# Patient Record
Sex: Male | Born: 1940 | Race: White | Hispanic: No | Marital: Married | Smoking: Current some day smoker
Health system: Southern US, Community
[De-identification: ages and names within clinical notes are randomized; demographics above are authoritative.]

## PROBLEM LIST (undated history)

## (undated) DIAGNOSIS — K219 Gastro-esophageal reflux disease without esophagitis: Secondary | ICD-10-CM

## (undated) DIAGNOSIS — J3089 Other allergic rhinitis: Secondary | ICD-10-CM

## (undated) DIAGNOSIS — M1990 Unspecified osteoarthritis, unspecified site: Secondary | ICD-10-CM

## (undated) DIAGNOSIS — E119 Type 2 diabetes mellitus without complications: Secondary | ICD-10-CM

## (undated) DIAGNOSIS — I2510 Atherosclerotic heart disease of native coronary artery without angina pectoris: Secondary | ICD-10-CM

## (undated) DIAGNOSIS — J189 Pneumonia, unspecified organism: Secondary | ICD-10-CM

## (undated) DIAGNOSIS — E785 Hyperlipidemia, unspecified: Secondary | ICD-10-CM

## (undated) DIAGNOSIS — C4490 Unspecified malignant neoplasm of skin, unspecified: Secondary | ICD-10-CM

## (undated) DIAGNOSIS — I10 Essential (primary) hypertension: Secondary | ICD-10-CM

## (undated) HISTORY — PX: CORONARY ARTERY BYPASS GRAFT: SHX141

## (undated) HISTORY — PX: COLONOSCOPY W/ POLYPECTOMY: SHX1380

## (undated) HISTORY — PX: EYE SURGERY: SHX253

## (undated) HISTORY — PX: CARDIAC CATHETERIZATION: SHX172

---

## 2015-05-19 ENCOUNTER — Other Ambulatory Visit (HOSPITAL_COMMUNITY): Payer: Self-pay | Admitting: Orthopaedic Surgery

## 2015-05-19 NOTE — Pre-Procedure Instructions (Signed)
Harold Hicks  05/19/2015     Your procedure is scheduled on : Friday May 23, 2015 at 8:45 AM.  Report to [facility] Admitting at 6:45 AM.  Call this number if you have problems the morning of surgery: [phone]    Remember:  Do not eat food or drink liquids after midnight.  Take these medicines the morning of surgery with A SIP OF WATER : Metoprolol (Toprol XL), Omeprazole (Prilosec)   Stop taking any vitamins, herbal medications/supplements, Co-Q10, Os-cal, Fish Oil, NSAIDs, Ibuprofen, Advil, Motrin, Aleve, etc today   Do NOT take any diabetic pills the morning of your surgery (NO Metformin/Glucophage)  How to Manage Your Diabetes Before Surgery   Why is it important to control my blood sugar before and after surgery?   Improving blood sugar levels before and after surgery helps healing and can limit problems.  A way of improving blood sugar control is eating a healthy diet by:  - Eating less sugar and carbohydrates  - Increasing activity/exercise  - Talk with your doctor about reaching your blood sugar goals  High blood sugars (greater than 180 mg/dL) can raise your risk of infections and slow down your recovery so you will need to focus on controlling your diabetes during the weeks before surgery.  Make sure that the doctor who takes care of your diabetes knows about your planned surgery including the date and location.  How do I manage my blood sugars before surgery?   Check your blood sugar at least 4 times a day, 2 days before surgery to make sure that they are not too high or low.   Check your blood sugar the morning of your surgery when you wake up and every 2 hours until you get to the Short-Stay unit.  If your blood sugar is less than 70 mg/dL, you will need to treat for low blood sugar by:  Treat a low blood sugar (less than 70 mg/dL) with 1/2 cup of clear juice (cranberry or apple), 4 glucose tablets, OR glucose gel.  Recheck blood sugar in  15 minutes after treatment (to make sure it is greater than 70 mg/dL).  If blood sugar is not greater than 70 mg/dL on re-check, call [phone] for further instructions.   Report your blood sugar to the Short-Stay nurse when you get to Short-Stay.  References:  [facility], 2007 "How to Manage your Diabetes Before and After Surgery".  What do I do about my diabetes medications?   Do not take oral diabetes medicines (pills) the morning of surgery.   Do not wear jewelry.  Do not wear lotions, powders, or cologne.    Men may shave face and neck.  Do not bring valuables to the hospital.  [facility] is not responsible for any belongings or valuables.  Contacts, dentures or bridgework may not be worn into surgery.  Leave your suitcase in the car.  After surgery it may be brought to your room.  For patients admitted to the hospital, discharge time will be determined by your treatment team.  Patients discharged the day of surgery will not be allowed to drive home.   Name and phone number of your driver:    Special instructions:  Shower using CHG soap the night before and the morning of your surgery  Please read over the following fact sheets that you were given. Pain Booklet, Coughing and Deep Breathing and Surgical Site Infection Prevention

## 2015-05-20 ENCOUNTER — Encounter (HOSPITAL_COMMUNITY)
Admission: RE | Admit: 2015-05-20 | Discharge: 2015-05-20 | Disposition: A | Discharge status: Home / Self Care | Payer: Medicare Other | Source: Ambulatory Visit | Attending: Orthopaedic Surgery | Admitting: Orthopaedic Surgery

## 2015-05-20 ENCOUNTER — Encounter (HOSPITAL_COMMUNITY): Payer: Self-pay

## 2015-05-20 DIAGNOSIS — Z955 Presence of coronary angioplasty implant and graft: Secondary | ICD-10-CM | POA: Diagnosis not present

## 2015-05-20 DIAGNOSIS — I10 Essential (primary) hypertension: Secondary | ICD-10-CM | POA: Insufficient documentation

## 2015-05-20 DIAGNOSIS — Z79899 Other long term (current) drug therapy: Secondary | ICD-10-CM | POA: Insufficient documentation

## 2015-05-20 DIAGNOSIS — F17200 Nicotine dependence, unspecified, uncomplicated: Secondary | ICD-10-CM | POA: Insufficient documentation

## 2015-05-20 DIAGNOSIS — Z7984 Long term (current) use of oral hypoglycemic drugs: Secondary | ICD-10-CM | POA: Insufficient documentation

## 2015-05-20 DIAGNOSIS — Z951 Presence of aortocoronary bypass graft: Secondary | ICD-10-CM | POA: Diagnosis not present

## 2015-05-20 DIAGNOSIS — Z01818 Encounter for other preprocedural examination: Secondary | ICD-10-CM | POA: Insufficient documentation

## 2015-05-20 DIAGNOSIS — I2510 Atherosclerotic heart disease of native coronary artery without angina pectoris: Secondary | ICD-10-CM | POA: Diagnosis not present

## 2015-05-20 DIAGNOSIS — Z01812 Encounter for preprocedural laboratory examination: Secondary | ICD-10-CM | POA: Diagnosis not present

## 2015-05-20 DIAGNOSIS — E785 Hyperlipidemia, unspecified: Secondary | ICD-10-CM | POA: Diagnosis not present

## 2015-05-20 DIAGNOSIS — E119 Type 2 diabetes mellitus without complications: Secondary | ICD-10-CM | POA: Insufficient documentation

## 2015-05-20 DIAGNOSIS — Z7982 Long term (current) use of aspirin: Secondary | ICD-10-CM | POA: Insufficient documentation

## 2015-05-20 DIAGNOSIS — K219 Gastro-esophageal reflux disease without esophagitis: Secondary | ICD-10-CM | POA: Diagnosis not present

## 2015-05-20 HISTORY — DX: Unspecified osteoarthritis, unspecified site: M19.90

## 2015-05-20 HISTORY — DX: Other allergic rhinitis: J30.89

## 2015-05-20 HISTORY — DX: Pneumonia, unspecified organism: J18.9

## 2015-05-20 HISTORY — DX: Essential (primary) hypertension: I10

## 2015-05-20 HISTORY — DX: Gastro-esophageal reflux disease without esophagitis: K21.9

## 2015-05-20 HISTORY — DX: Unspecified malignant neoplasm of skin, unspecified: C44.90

## 2015-05-20 HISTORY — DX: Hyperlipidemia, unspecified: E78.5

## 2015-05-20 HISTORY — DX: Type 2 diabetes mellitus without complications: E11.9

## 2015-05-20 LAB — CBC
HCT: 37.9 % — ABNORMAL LOW (ref 39.0–52.0)
HEMOGLOBIN: 12.9 g/dL — AB (ref 13.0–17.0)
MCH: 29.1 pg (ref 26.0–34.0)
MCHC: 34 g/dL (ref 30.0–36.0)
MCV: 85.6 fL (ref 78.0–100.0)
PLATELETS: 204 10*3/uL (ref 150–400)
RBC: 4.43 MIL/uL (ref 4.22–5.81)
RDW: 12.7 % (ref 11.5–15.5)
WBC: 6.6 10*3/uL (ref 4.0–10.5)

## 2015-05-20 LAB — BASIC METABOLIC PANEL
ANION GAP: 16 — AB (ref 5–15)
BUN: 19 mg/dL (ref 6–20)
CHLORIDE: 94 mmol/L — AB (ref 101–111)
CO2: 26 mmol/L (ref 22–32)
Calcium: 9.4 mg/dL (ref 8.9–10.3)
Creatinine, Ser: 0.93 mg/dL (ref 0.61–1.24)
GFR calc Af Amer: >60 mL/min (ref >60)
GLUCOSE: 212 mg/dL — AB (ref 65–99)
POTASSIUM: 3.8 mmol/L (ref 3.5–5.1)
SODIUM: 136 mmol/L (ref 135–145)

## 2015-05-20 LAB — GLUCOSE, CAPILLARY: Glucose-Capillary: 202 mg/dL — ABNORMAL HIGH (ref 65–99)

## 2015-05-20 NOTE — Progress Notes (Signed)
Abnormal EKG noted. Will request comparison from PCP's office.

## 2015-05-20 NOTE — Progress Notes (Signed)
PCP is Garlan Fillers, DO  Patient informed Nurse that he had a open heart surgery about six or seven years ago at [facility]. Patient informed Nurse that he does not have a cardiologist, he only sees Dr. Nicola Girt.  Patient denied having any acute cardiac or pulmonary issues  Nurse inquired about fasting blood glucose levels, and patient informed Nurse that his blood sugars range from 240 to 180, and his last A1C was 7.8. CBG on arrival to PAT was 202; patient stated just left [employer] and had breakfast, and drank two cups of coffee.

## 2015-05-21 ENCOUNTER — Encounter (HOSPITAL_COMMUNITY): Payer: Self-pay | Admitting: Emergency Medicine

## 2015-05-21 LAB — HEMOGLOBIN A1C
Hgb A1c MFr Bld: 8.3 % — ABNORMAL HIGH (ref 4.8–5.6)
MEAN PLASMA GLUCOSE: 192 mg/dL

## 2015-05-21 NOTE — Progress Notes (Signed)
Anesthesia Chart Review:  Pt is a 75 year old male scheduled for ORIF L humeral midshaft fracture nonunion on 05/23/2015 with Dr. Erlinda Hong.   PMH includes:  CAD (S/p BMS x3 to ramus intermediate 12/02/07 that failed -> s/p CABG (LIMA to LAD, SVG to ramus 12/06/07) (at [facility])), HTN, DM, hyperlipidemia, GERD. Current smoker. BMI 30.   Medications include: ASA, hctz, metformin, metoprolol, olmesartan, prilosec, simvastatin.   Preoperative labs reviewed.  Glucose 212, hgbA1c 8.3.   EKG 05/20/15: Sinus rhythm. Possible Anterior infarct, age undetermined. ST & T wave abnormality, consider lateral ischemia. I cannot locate an EKG for comparison.   Echo 12/05/07:  - The left ventricle is normal in size. There is normal left ventricular wall thickness.  - Mild aortic sclerosis is present with good valvular opening.  - The left ventricular ejection fraction is normal (60-65%).  - There is mild mitral regurgitation (1+).  - There is mild tricuspid regurgitation (1+).  Cardiac cath 12/04/07 (care everywhere): - LM: normal - LAD: prox 20%, mid 40-50% -> farther down in mid area was diffuse 40% disease - CX: prox 40%, remainder free of disease. OM1 100% occluded at ostium. It is this OM that had 3 BMS placed less than a week ago - RCA: free of disease - LV with mildly anterolateral hypokinesis. EF 55%  Reviewed case with Dr. Linna Caprice. Pt has not seen cardiologist in years. Pt will need cardiac evaluation prior to surgery. Notified Sherrie in Dr. Phoebe Sharps office.   Willeen Cass, FNP-BC [facility] Short Stay Surgical Center/Anesthesiology Phone: [phone] 05/21/2015 3:31 PM

## 2015-05-23 ENCOUNTER — Encounter (HOSPITAL_COMMUNITY): Admission: RE | Payer: Self-pay | Source: Ambulatory Visit

## 2015-05-23 ENCOUNTER — Ambulatory Visit (HOSPITAL_COMMUNITY): Admission: RE | Admit: 2015-05-23 | Payer: Medicare Other | Source: Ambulatory Visit | Admitting: Orthopaedic Surgery

## 2015-05-23 SURGERY — OPEN REDUCTION INTERNAL FIXATION (ORIF) HUMERAL SHAFT FRACTURE
Anesthesia: General | Laterality: Left

## 2015-06-05 ENCOUNTER — Encounter (HOSPITAL_COMMUNITY): Payer: Self-pay | Admitting: Emergency Medicine

## 2015-06-05 MED ORDER — CEFAZOLIN SODIUM-DEXTROSE 2-3 GM-% IV SOLR
2.0000 g | INTRAVENOUS | Status: AC
  Administered 2015-06-06: 2 g via INTRAVENOUS
  Filled 2015-06-05: qty 50

## 2015-06-05 NOTE — Progress Notes (Signed)
I spoke with patient to confirm arrival time and date, 06/05/25- 9:45 AM

## 2015-06-05 NOTE — Progress Notes (Signed)
Anesthesia Chart Review:  Pt is a 75 year old male scheduled for ORIF L humerus nonunion on 06/06/2015 with Dr. Erlinda Hong.   Pt is a same day work up.   PCP is Dr. Garlan Fillers (care everywhere), who has cleared pt for surgery.   PMH includes: CAD (S/p BMS x3 to ramus intermediate 12/02/07 that failed -> s/p CABG (LIMA to LAD, SVG to ramus 12/06/07) (at [facility])), HTN, DM, hyperlipidemia, GERD. Current smoker. BMI 30.   Medications include: ASA, hctz, metformin, metoprolol, olmesartan, prilosec, simvastatin.   Pt will need labs DOS.   EKG 05/20/15: Sinus rhythm. Possible Anterior infarct, age undetermined. ST & T wave abnormality, consider lateral ischemia. I cannot locate an EKG for comparison.   Echo 12/05/07:  - The left ventricle is normal in size. There is normal left ventricular wall thickness.  - Mild aortic sclerosis is present with good valvular opening.  - The left ventricular ejection fraction is normal (60-65%).  - There is mild mitral regurgitation (1+).  - There is mild tricuspid regurgitation (1+).  Cardiac cath 12/04/07 (care everywhere): - LM: normal - LAD: prox 20%, mid 40-50% -> farther down in mid area was diffuse 40% disease - CX: prox 40%, remainder free of disease. OM1 100% occluded at ostium. It is this OM that had 3 BMS placed less than a week ago - RCA: free of disease - LV with mildly anterolateral hypokinesis. EF 55%  Pt was originally scheduled for surgery 05/23/15 but hadn't seen cardiology in many years, and he was asked to get evaluated by cardiology prior to surgery. Pt saw Dr. Michiel Sites Noureddine (care everywhere) on 05/28/15.  Dr. Benay Pillow clears pt for surgery, noting "Patient can do > 4 METS with no symptoms. The patient has acceptable perioperative risk for cardiac events related to surgery".   If labs acceptable DOS, I anticipate pt can proceed as scheduled.   Willeen Cass, FNP-BC [facility] Short Stay Surgical Center/Anesthesiology Phone:  [phone] 06/05/2015 1:12 PM

## 2015-06-06 ENCOUNTER — Ambulatory Visit (HOSPITAL_COMMUNITY): Payer: Medicare Other | Admitting: Emergency Medicine

## 2015-06-06 ENCOUNTER — Ambulatory Visit (HOSPITAL_COMMUNITY): Payer: Medicare Other

## 2015-06-06 ENCOUNTER — Encounter (HOSPITAL_COMMUNITY)
Admission: RE | Disposition: A | Discharge status: Home / Self Care | Payer: Self-pay | Source: Ambulatory Visit | Attending: Orthopaedic Surgery

## 2015-06-06 ENCOUNTER — Observation Stay (HOSPITAL_COMMUNITY)
Admission: RE | Admit: 2015-06-06 | Discharge: 2015-06-07 | Disposition: A | Discharge status: Home / Self Care | Payer: Medicare Other | Source: Ambulatory Visit | Attending: Orthopaedic Surgery | Admitting: Orthopaedic Surgery

## 2015-06-06 ENCOUNTER — Encounter (HOSPITAL_COMMUNITY): Payer: Self-pay | Admitting: *Deleted

## 2015-06-06 DIAGNOSIS — Z955 Presence of coronary angioplasty implant and graft: Secondary | ICD-10-CM | POA: Diagnosis not present

## 2015-06-06 DIAGNOSIS — I2510 Atherosclerotic heart disease of native coronary artery without angina pectoris: Secondary | ICD-10-CM | POA: Diagnosis not present

## 2015-06-06 DIAGNOSIS — Z7984 Long term (current) use of oral hypoglycemic drugs: Secondary | ICD-10-CM | POA: Insufficient documentation

## 2015-06-06 DIAGNOSIS — Z7982 Long term (current) use of aspirin: Secondary | ICD-10-CM | POA: Diagnosis not present

## 2015-06-06 DIAGNOSIS — Z419 Encounter for procedure for purposes other than remedying health state, unspecified: Secondary | ICD-10-CM

## 2015-06-06 DIAGNOSIS — M1990 Unspecified osteoarthritis, unspecified site: Secondary | ICD-10-CM | POA: Insufficient documentation

## 2015-06-06 DIAGNOSIS — S42302K Unspecified fracture of shaft of humerus, left arm, subsequent encounter for fracture with nonunion: Principal | ICD-10-CM | POA: Insufficient documentation

## 2015-06-06 DIAGNOSIS — M79602 Pain in left arm: Secondary | ICD-10-CM | POA: Insufficient documentation

## 2015-06-06 DIAGNOSIS — I10 Essential (primary) hypertension: Secondary | ICD-10-CM | POA: Diagnosis not present

## 2015-06-06 DIAGNOSIS — K219 Gastro-esophageal reflux disease without esophagitis: Secondary | ICD-10-CM | POA: Insufficient documentation

## 2015-06-06 DIAGNOSIS — Z8781 Personal history of (healed) traumatic fracture: Secondary | ICD-10-CM

## 2015-06-06 DIAGNOSIS — X58XXXD Exposure to other specified factors, subsequent encounter: Secondary | ICD-10-CM | POA: Insufficient documentation

## 2015-06-06 DIAGNOSIS — E785 Hyperlipidemia, unspecified: Secondary | ICD-10-CM | POA: Insufficient documentation

## 2015-06-06 DIAGNOSIS — Z85828 Personal history of other malignant neoplasm of skin: Secondary | ICD-10-CM | POA: Diagnosis not present

## 2015-06-06 DIAGNOSIS — F17290 Nicotine dependence, other tobacco product, uncomplicated: Secondary | ICD-10-CM | POA: Insufficient documentation

## 2015-06-06 DIAGNOSIS — Z9889 Other specified postprocedural states: Secondary | ICD-10-CM

## 2015-06-06 DIAGNOSIS — E119 Type 2 diabetes mellitus without complications: Secondary | ICD-10-CM | POA: Insufficient documentation

## 2015-06-06 DIAGNOSIS — Z951 Presence of aortocoronary bypass graft: Secondary | ICD-10-CM | POA: Diagnosis not present

## 2015-06-06 DIAGNOSIS — Z79899 Other long term (current) drug therapy: Secondary | ICD-10-CM | POA: Diagnosis not present

## 2015-06-06 HISTORY — DX: Atherosclerotic heart disease of native coronary artery without angina pectoris: I25.10

## 2015-06-06 HISTORY — PX: ORIF HUMERUS FRACTURE: SHX2126

## 2015-06-06 LAB — BASIC METABOLIC PANEL
Anion gap: 15 (ref 5–15)
BUN: 15 mg/dL (ref 6–20)
CALCIUM: 9.6 mg/dL (ref 8.9–10.3)
CO2: 26 mmol/L (ref 22–32)
CREATININE: 0.81 mg/dL (ref 0.61–1.24)
Chloride: 95 mmol/L — ABNORMAL LOW (ref 101–111)
GFR calc Af Amer: >60 mL/min (ref >60)
GFR calc non Af Amer: >60 mL/min (ref >60)
GLUCOSE: 181 mg/dL — AB (ref 65–99)
Potassium: 3.6 mmol/L (ref 3.5–5.1)
Sodium: 136 mmol/L (ref 135–145)

## 2015-06-06 LAB — CBC
HCT: 38 % — ABNORMAL LOW (ref 39.0–52.0)
Hemoglobin: 13.7 g/dL (ref 13.0–17.0)
MCH: 30.3 pg (ref 26.0–34.0)
MCHC: 36.1 g/dL — AB (ref 30.0–36.0)
MCV: 84.1 fL (ref 78.0–100.0)
Platelets: 189 10*3/uL (ref 150–400)
RBC: 4.52 MIL/uL (ref 4.22–5.81)
RDW: 12.7 % (ref 11.5–15.5)
WBC: 8.5 10*3/uL (ref 4.0–10.5)

## 2015-06-06 LAB — GLUCOSE, CAPILLARY: Glucose-Capillary: 189 mg/dL — ABNORMAL HIGH (ref 65–99)

## 2015-06-06 SURGERY — OPEN REDUCTION INTERNAL FIXATION (ORIF) HUMERAL SHAFT FRACTURE
Anesthesia: Regional | Laterality: Left

## 2015-06-06 MED ORDER — CALCIUM CARBONATE-VITAMIN D 500-200 MG-UNIT PO TABS: 1.0000 | ORAL_TABLET | Freq: Three times a day (TID) | ORAL | Status: AC

## 2015-06-06 MED ORDER — SENNOSIDES-DOCUSATE SODIUM 8.6-50 MG PO TABS: 1.0000 | ORAL_TABLET | Freq: Every evening | ORAL | Status: AC | PRN

## 2015-06-06 MED ORDER — ACETAMINOPHEN 325 MG PO TABS
650.0000 mg | ORAL_TABLET | Freq: Four times a day (QID) | ORAL | Status: DC | PRN
  Administered 2015-06-07: 650 mg via ORAL
  Filled 2015-06-06: qty 2

## 2015-06-06 MED ORDER — ROCURONIUM BROMIDE 50 MG/5ML IV SOLN
INTRAVENOUS | Status: AC
  Filled 2015-06-06: qty 1

## 2015-06-06 MED ORDER — LIDOCAINE HCL (CARDIAC) 20 MG/ML IV SOLN
INTRAVENOUS | Status: AC
  Filled 2015-06-06: qty 5

## 2015-06-06 MED ORDER — COENZYME Q10 30 MG PO CAPS: 30.0000 mg | ORAL_CAPSULE | Freq: Every day | ORAL | Status: DC

## 2015-06-06 MED ORDER — METHOCARBAMOL 1000 MG/10ML IJ SOLN
500.0000 mg | Freq: Four times a day (QID) | INTRAVENOUS | Status: DC | PRN
  Filled 2015-06-06: qty 5

## 2015-06-06 MED ORDER — VANCOMYCIN HCL 1000 MG IV SOLR
INTRAVENOUS | Status: DC | PRN
  Administered 2015-06-06: 1000 mg

## 2015-06-06 MED ORDER — CEFAZOLIN SODIUM-DEXTROSE 2-3 GM-% IV SOLR
2.0000 g | Freq: Four times a day (QID) | INTRAVENOUS | Status: AC
  Administered 2015-06-06 – 2015-06-07 (×3): 2 g via INTRAVENOUS
  Filled 2015-06-06 (×3): qty 50

## 2015-06-06 MED ORDER — OXYCODONE HCL ER 10 MG PO T12A
10.0000 mg | EXTENDED_RELEASE_TABLET | Freq: Two times a day (BID) | ORAL | Status: AC
Start: 2015-06-06 — End: ?

## 2015-06-06 MED ORDER — CALCIUM CARBONATE 1250 (500 CA) MG PO TABS
1250.0000 mg | ORAL_TABLET | Freq: Every day | ORAL | Status: DC
  Administered 2015-06-07: 1250 mg via ORAL
  Filled 2015-06-06 (×2): qty 1

## 2015-06-06 MED ORDER — SORBITOL 70 % SOLN: 30.0000 mL | Freq: Every day | Status: DC | PRN

## 2015-06-06 MED ORDER — SODIUM CHLORIDE 0.9 % IV SOLN
INTRAVENOUS | Status: DC
  Administered 2015-06-06: 125 mL/h via INTRAVENOUS

## 2015-06-06 MED ORDER — LACTATED RINGERS IV SOLN
INTRAVENOUS | Status: DC
  Administered 2015-06-06 (×3): via INTRAVENOUS

## 2015-06-06 MED ORDER — PROMETHAZINE HCL 25 MG/ML IJ SOLN: 6.2500 mg | INTRAMUSCULAR | Status: DC | PRN

## 2015-06-06 MED ORDER — FENTANYL CITRATE (PF) 100 MCG/2ML IJ SOLN: 25.0000 ug | INTRAMUSCULAR | Status: DC | PRN

## 2015-06-06 MED ORDER — ONDANSETRON HCL 4 MG/2ML IJ SOLN
INTRAMUSCULAR | Status: AC
  Filled 2015-06-06: qty 2

## 2015-06-06 MED ORDER — PHENYLEPHRINE HCL 10 MG/ML IJ SOLN
10.0000 mg | INTRAVENOUS | Status: DC | PRN
  Administered 2015-06-06: 20 ug/min via INTRAVENOUS

## 2015-06-06 MED ORDER — METOCLOPRAMIDE HCL 5 MG/ML IJ SOLN: 5.0000 mg | Freq: Three times a day (TID) | INTRAMUSCULAR | Status: DC | PRN

## 2015-06-06 MED ORDER — VANCOMYCIN HCL 1000 MG IV SOLR
INTRAVENOUS | Status: AC
  Filled 2015-06-06: qty 1000

## 2015-06-06 MED ORDER — DIPHENHYDRAMINE HCL 12.5 MG/5ML PO ELIX: 25.0000 mg | ORAL_SOLUTION | ORAL | Status: DC | PRN

## 2015-06-06 MED ORDER — OXYCODONE HCL ER 10 MG PO T12A
10.0000 mg | EXTENDED_RELEASE_TABLET | Freq: Two times a day (BID) | ORAL | Status: DC
  Administered 2015-06-07: 10 mg via ORAL
  Filled 2015-06-06 (×2): qty 1

## 2015-06-06 MED ORDER — EPHEDRINE SULFATE 50 MG/ML IJ SOLN
INTRAMUSCULAR | Status: DC | PRN
  Administered 2015-06-06: 10 mg via INTRAVENOUS
  Administered 2015-06-06: 15 mg via INTRAVENOUS

## 2015-06-06 MED ORDER — SIMVASTATIN 20 MG PO TABS
20.0000 mg | ORAL_TABLET | Freq: Every evening | ORAL | Status: DC
  Administered 2015-06-06: 20 mg via ORAL
  Filled 2015-06-06: qty 1

## 2015-06-06 MED ORDER — ONDANSETRON HCL 4 MG/2ML IJ SOLN
INTRAMUSCULAR | Status: DC | PRN
  Administered 2015-06-06 (×2): 4 mg via INTRAVENOUS

## 2015-06-06 MED ORDER — OXYCODONE-ACETAMINOPHEN 5-325 MG PO TABS: 1.0000 | ORAL_TABLET | ORAL | Status: AC | PRN

## 2015-06-06 MED ORDER — ONDANSETRON HCL 4 MG PO TABS: 4.0000 mg | ORAL_TABLET | Freq: Four times a day (QID) | ORAL | Status: DC | PRN

## 2015-06-06 MED ORDER — MONTELUKAST SODIUM 10 MG PO TABS
10.0000 mg | ORAL_TABLET | Freq: Every day | ORAL | Status: DC
  Administered 2015-06-06: 10 mg via ORAL
  Filled 2015-06-06: qty 1

## 2015-06-06 MED ORDER — MAGNESIUM CITRATE PO SOLN: 1.0000 | Freq: Once | ORAL | Status: DC | PRN

## 2015-06-06 MED ORDER — FENTANYL CITRATE (PF) 250 MCG/5ML IJ SOLN
INTRAMUSCULAR | Status: AC
  Filled 2015-06-06: qty 5

## 2015-06-06 MED ORDER — METOCLOPRAMIDE HCL 5 MG PO TABS: 5.0000 mg | ORAL_TABLET | Freq: Three times a day (TID) | ORAL | Status: DC | PRN

## 2015-06-06 MED ORDER — METOPROLOL SUCCINATE ER 100 MG PO TB24
100.0000 mg | ORAL_TABLET | Freq: Every day | ORAL | Status: DC
  Administered 2015-06-07: 100 mg via ORAL
  Filled 2015-06-06 (×3): qty 1

## 2015-06-06 MED ORDER — ZINC SULFATE 220 (50 ZN) MG PO CAPS: 220.0000 mg | ORAL_CAPSULE | Freq: Every day | ORAL | Status: AC

## 2015-06-06 MED ORDER — ROCURONIUM BROMIDE 100 MG/10ML IV SOLN
INTRAVENOUS | Status: DC | PRN
Start: 2015-06-06 — End: 2015-06-06
  Administered 2015-06-06: 40 mg via INTRAVENOUS

## 2015-06-06 MED ORDER — ZINC SULFATE 220 (50 ZN) MG PO CAPS
220.0000 mg | ORAL_CAPSULE | Freq: Every day | ORAL | Status: DC
  Administered 2015-06-06 – 2015-06-07 (×2): 220 mg via ORAL
  Filled 2015-06-06 (×2): qty 1

## 2015-06-06 MED ORDER — ACETAMINOPHEN 650 MG RE SUPP: 650.0000 mg | Freq: Four times a day (QID) | RECTAL | Status: DC | PRN

## 2015-06-06 MED ORDER — DEXAMETHASONE SODIUM PHOSPHATE 4 MG/ML IJ SOLN
INTRAMUSCULAR | Status: AC
  Filled 2015-06-06: qty 2

## 2015-06-06 MED ORDER — METHOCARBAMOL 750 MG PO TABS: 750.0000 mg | ORAL_TABLET | Freq: Two times a day (BID) | ORAL | Status: AC | PRN

## 2015-06-06 MED ORDER — MEPERIDINE HCL 25 MG/ML IJ SOLN: 6.2500 mg | INTRAMUSCULAR | Status: DC | PRN

## 2015-06-06 MED ORDER — LACTATED RINGERS IV SOLN: INTRAVENOUS | Status: DC

## 2015-06-06 MED ORDER — GLYCOPYRROLATE 0.2 MG/ML IJ SOLN
INTRAMUSCULAR | Status: DC | PRN
  Administered 2015-06-06: 0.2 mg via INTRAVENOUS

## 2015-06-06 MED ORDER — DEXAMETHASONE SODIUM PHOSPHATE 4 MG/ML IJ SOLN: INTRAMUSCULAR | Status: DC | PRN

## 2015-06-06 MED ORDER — BUPIVACAINE HCL (PF) 0.25 % IJ SOLN
INTRAMUSCULAR | Status: AC
  Filled 2015-06-06: qty 30

## 2015-06-06 MED ORDER — PROPOFOL 10 MG/ML IV BOLUS
INTRAVENOUS | Status: AC
  Filled 2015-06-06: qty 20

## 2015-06-06 MED ORDER — HYDROCHLOROTHIAZIDE 25 MG PO TABS
25.0000 mg | ORAL_TABLET | Freq: Every day | ORAL | Status: DC
  Administered 2015-06-06 – 2015-06-07 (×2): 25 mg via ORAL
  Filled 2015-06-06 (×2): qty 1

## 2015-06-06 MED ORDER — VITAMIN C 500 MG PO CHEW: 500.0000 mg | CHEWABLE_TABLET | Freq: Two times a day (BID) | ORAL | Status: AC

## 2015-06-06 MED ORDER — ONDANSETRON HCL 4 MG/2ML IJ SOLN: 4.0000 mg | Freq: Four times a day (QID) | INTRAMUSCULAR | Status: DC | PRN

## 2015-06-06 MED ORDER — POLYETHYLENE GLYCOL 3350 17 G PO PACK: 17.0000 g | PACK | Freq: Every day | ORAL | Status: DC | PRN

## 2015-06-06 MED ORDER — NEOSTIGMINE METHYLSULFATE 10 MG/10ML IV SOLN
INTRAVENOUS | Status: DC | PRN
  Administered 2015-06-06: 2 mg via INTRAVENOUS

## 2015-06-06 MED ORDER — BUPIVACAINE-EPINEPHRINE (PF) 0.5% -1:200000 IJ SOLN
INTRAMUSCULAR | Status: DC | PRN
  Administered 2015-06-06: 30 mL via PERINEURAL

## 2015-06-06 MED ORDER — INSULIN ASPART 100 UNIT/ML ~~LOC~~ SOLN: 0.0000 [IU] | Freq: Every day | SUBCUTANEOUS | Status: DC

## 2015-06-06 MED ORDER — PROPOFOL 10 MG/ML IV BOLUS
INTRAVENOUS | Status: DC | PRN
  Administered 2015-06-06: 150 mg via INTRAVENOUS

## 2015-06-06 MED ORDER — METHOCARBAMOL 500 MG PO TABS
500.0000 mg | ORAL_TABLET | Freq: Four times a day (QID) | ORAL | Status: DC | PRN
  Administered 2015-06-07: 500 mg via ORAL
  Filled 2015-06-06: qty 1

## 2015-06-06 MED ORDER — MORPHINE SULFATE (PF) 2 MG/ML IV SOLN: 2.0000 mg | INTRAVENOUS | Status: DC | PRN

## 2015-06-06 MED ORDER — OXYCODONE HCL 5 MG PO TABS
5.0000 mg | ORAL_TABLET | ORAL | Status: DC | PRN
  Administered 2015-06-07: 15 mg via ORAL
  Administered 2015-06-07 (×3): 10 mg via ORAL
  Filled 2015-06-06 (×2): qty 2
  Filled 2015-06-06: qty 3
  Filled 2015-06-06: qty 2

## 2015-06-06 MED ORDER — FENTANYL CITRATE (PF) 100 MCG/2ML IJ SOLN
INTRAMUSCULAR | Status: DC | PRN
  Administered 2015-06-06 (×2): 50 ug via INTRAVENOUS
  Administered 2015-06-06: 100 ug via INTRAVENOUS
  Administered 2015-06-06: 50 ug via INTRAVENOUS

## 2015-06-06 MED ORDER — TEMAZEPAM 15 MG PO CAPS
15.0000 mg | ORAL_CAPSULE | Freq: Every evening | ORAL | Status: DC | PRN
  Administered 2015-06-06: 15 mg via ORAL
  Filled 2015-06-06: qty 1

## 2015-06-06 MED ORDER — INSULIN ASPART 100 UNIT/ML ~~LOC~~ SOLN
0.0000 [IU] | Freq: Three times a day (TID) | SUBCUTANEOUS | Status: DC
  Administered 2015-06-07: 3 [IU] via SUBCUTANEOUS
  Administered 2015-06-07: 5 [IU] via SUBCUTANEOUS

## 2015-06-06 MED ORDER — PHENYLEPHRINE HCL 10 MG/ML IJ SOLN
INTRAMUSCULAR | Status: DC | PRN
  Administered 2015-06-06 (×3): 80 ug via INTRAVENOUS

## 2015-06-06 MED ORDER — LIDOCAINE HCL (CARDIAC) 20 MG/ML IV SOLN
INTRAVENOUS | Status: DC | PRN
  Administered 2015-06-06: 60 mg via INTRAVENOUS

## 2015-06-06 MED ORDER — ONDANSETRON HCL 4 MG PO TABS: 4.0000 mg | ORAL_TABLET | Freq: Three times a day (TID) | ORAL | Status: AC | PRN

## 2015-06-06 MED ORDER — IRBESARTAN 300 MG PO TABS
300.0000 mg | ORAL_TABLET | Freq: Every day | ORAL | Status: DC
  Administered 2015-06-06 – 2015-06-07 (×2): 300 mg via ORAL
  Filled 2015-06-06 (×2): qty 1

## 2015-06-06 MED ORDER — VITAMIN C 500 MG PO TABS
500.0000 mg | ORAL_TABLET | Freq: Two times a day (BID) | ORAL | Status: DC
  Administered 2015-06-06 – 2015-06-07 (×2): 500 mg via ORAL
  Filled 2015-06-06 (×2): qty 1

## 2015-06-06 MED ORDER — PANTOPRAZOLE SODIUM 40 MG PO TBEC
40.0000 mg | DELAYED_RELEASE_TABLET | Freq: Every day | ORAL | Status: DC
  Administered 2015-06-07: 40 mg via ORAL
  Filled 2015-06-06 (×2): qty 1

## 2015-06-06 SURGICAL SUPPLY — 69 items
BIT DRILL 3.5 QC 155 (BIT) ×2 IMPLANT
BLADE SURG 10 STRL SS (BLADE) IMPLANT
BNDG ESMARK 4X9 LF (GAUZE/BANDAGES/DRESSINGS) IMPLANT
CLOSURE STERI-STRIP 1/4X4 (GAUZE/BANDAGES/DRESSINGS) ×2 IMPLANT
CLSR STERI-STRIP ANTIMIC 1/2X4 (GAUZE/BANDAGES/DRESSINGS) ×2 IMPLANT
COVER SURGICAL LIGHT HANDLE (MISCELLANEOUS) ×2 IMPLANT
CUFF TOURNIQUET SINGLE 18IN (TOURNIQUET CUFF) ×2 IMPLANT
CUFF TOURNIQUET SINGLE 24IN (TOURNIQUET CUFF) IMPLANT
DRAPE C-ARM 42X72 X-RAY (DRAPES) ×2 IMPLANT
DRAPE IMP U-DRAPE 54X76 (DRAPES) ×2 IMPLANT
DRAPE INCISE IOBAN 66X45 STRL (DRAPES) ×2 IMPLANT
DRAPE PROXIMA HALF (DRAPES) ×4 IMPLANT
DRAPE U-SHAPE 47X51 STRL (DRAPES) ×2 IMPLANT
DRSG AQUACEL AG ADV 3.5X10 (GAUZE/BANDAGES/DRESSINGS) ×2 IMPLANT
DRSG TEGADERM 4X4.75 (GAUZE/BANDAGES/DRESSINGS) ×8 IMPLANT
ELECT CAUTERY BLADE 6.4 (BLADE) ×2 IMPLANT
ELECT REM PT RETURN 9FT ADLT (ELECTROSURGICAL) ×2
ELECTRODE REM PT RTRN 9FT ADLT (ELECTROSURGICAL) ×1 IMPLANT
FACESHIELD WRAPAROUND (MASK) ×2 IMPLANT
GAUZE SPONGE 4X4 12PLY STRL (GAUZE/BANDAGES/DRESSINGS) ×2 IMPLANT
GAUZE XEROFORM 5X9 LF (GAUZE/BANDAGES/DRESSINGS) ×2 IMPLANT
GLOVE BIOGEL PI IND STRL 6.5 (GLOVE) ×1 IMPLANT
GLOVE BIOGEL PI IND STRL 7.0 (GLOVE) ×2 IMPLANT
GLOVE BIOGEL PI INDICATOR 6.5 (GLOVE) ×1
GLOVE BIOGEL PI INDICATOR 7.0 (GLOVE) ×2
GLOVE SKINSENSE NS SZ7.5 (GLOVE) ×2
GLOVE SKINSENSE STRL SZ7.5 (GLOVE) ×2 IMPLANT
GLOVE SS BIOGEL STRL SZ 6.5 (GLOVE) ×2 IMPLANT
GLOVE SS BIOGEL STRL SZ 7.5 (GLOVE) ×2 IMPLANT
GLOVE SUPERSENSE BIOGEL SZ 6.5 (GLOVE) ×2
GLOVE SUPERSENSE BIOGEL SZ 7.5 (GLOVE) ×2
GOWN STRL REIN XL XLG (GOWN DISPOSABLE) ×6 IMPLANT
K-WIRE 2.0 (WIRE) ×2
K-WIRE FX228X2XTROC PNT (WIRE) ×2
KIT BASIN OR (CUSTOM PROCEDURE TRAY) ×2 IMPLANT
KIT ROOM TURNOVER OR (KITS) ×2 IMPLANT
KIT STIMULAN RAPID CURE 5CC (Orthopedic Implant) ×2 IMPLANT
KWIRE FX228X2XTROC PNT (WIRE) ×2 IMPLANT
LOOP VESSEL MAXI BLUE (MISCELLANEOUS) ×2 IMPLANT
MANIFOLD NEPTUNE II (INSTRUMENTS) ×2 IMPLANT
NS IRRIG 1000ML POUR BTL (IV SOLUTION) ×2 IMPLANT
PACK SHOULDER (CUSTOM PROCEDURE TRAY) ×2 IMPLANT
PACK UNIVERSAL I (CUSTOM PROCEDURE TRAY) ×2 IMPLANT
PAD ARMBOARD 7.5X6 YLW CONV (MISCELLANEOUS) ×4 IMPLANT
PAD CAST 4YDX4 CTTN HI CHSV (CAST SUPPLIES) ×1 IMPLANT
PADDING CAST COTTON 4X4 STRL (CAST SUPPLIES) ×1
PLATE 9-HOLE (Plate) ×2 IMPLANT
PUTTY DBX 5CC (Putty) ×2 IMPLANT
SCREW 3.5X36 (Screw) ×2 IMPLANT
SCREW 4.5X26 (Screw) ×4 IMPLANT
SCREW 4.5X28 (Screw) ×2 IMPLANT
SCREW NONLOCK 4.5X32 (Screw) ×4 IMPLANT
SLING ARM FOAM STRAP LRG (SOFTGOODS) ×2 IMPLANT
SLING ARM IMMOBILIZER LRG (SOFTGOODS) ×2 IMPLANT
SPONGE LAP 18X18 X RAY DECT (DISPOSABLE) ×2 IMPLANT
STAPLER VISISTAT 35W (STAPLE) IMPLANT
SUCTION FRAZIER HANDLE 10FR (MISCELLANEOUS)
SUCTION TUBE FRAZIER 10FR DISP (MISCELLANEOUS) IMPLANT
SUT ETHILON 3 0 PS 1 (SUTURE) ×4 IMPLANT
SUT MNCRL AB 4-0 PS2 18 (SUTURE) ×2 IMPLANT
SUT VIC AB 0 CT1 27 (SUTURE) ×1
SUT VIC AB 0 CT1 27XBRD ANBCTR (SUTURE) ×1 IMPLANT
SUT VIC AB 2-0 CT1 27 (SUTURE) ×2
SUT VIC AB 2-0 CT1 TAPERPNT 27 (SUTURE) ×2 IMPLANT
SYR CONTROL 10ML LL (SYRINGE) IMPLANT
TOWEL OR 17X24 6PK STRL BLUE (TOWEL DISPOSABLE) IMPLANT
TOWEL OR 17X26 10 PK STRL BLUE (TOWEL DISPOSABLE) ×2 IMPLANT
UNDERPAD 30X30 INCONTINENT (UNDERPADS AND DIAPERS) ×2 IMPLANT
WATER STERILE IRR 1000ML POUR (IV SOLUTION) ×2 IMPLANT

## 2015-06-06 NOTE — Transfer of Care (Signed)
Immediate Anesthesia Transfer of Care Note  Patient: Harold Hicks  Procedure(s) Performed: Procedure(s): OPEN REDUCTION INTERNAL FIXATION (ORIF) LEFT HUMERUS NONUNION (Left)  Patient Location: PACU  Anesthesia Type:General  Level of Consciousness: awake, alert , oriented and patient cooperative  Airway & Oxygen Therapy: Patient Spontanous Breathing and Patient connected to nasal cannula oxygen  Post-op Assessment: Report given to RN and Post -op Vital signs reviewed and stable  Post vital signs: Reviewed and stable  Last Vitals:  Filed Vitals:   06/06/15 1219 06/06/15 1725  BP: 193/86 133/66  Pulse:  52  Temp:  36.6 C  Resp:  9    Complications: No apparent anesthesia complications

## 2015-06-06 NOTE — Anesthesia Procedure Notes (Addendum)
Anesthesia Regional Block:  Interscalene brachial plexus block  Pre-Anesthetic Checklist: ,, timeout performed, Correct Patient, Correct Site, Correct Laterality, Correct Procedure, Correct Position, site marked, Risks and benefits discussed, pre-op evaluation,  At surgeon's request and post-op pain management  Laterality: Left  Prep: Maximum Sterile Barrier Precautions used and chloraprep       Needles:  Injection technique: Single-shot  Needle Type: Echogenic Stimulator Needle     Needle Length: 5cm 5 cm Needle Gauge: 22 and 22 G    Additional Needles:  Procedures: ultrasound guided (picture in chart) and nerve stimulator Interscalene brachial plexus block  Nerve Stimulator or Paresthesia:  Response: Biceps response,   Additional Responses:   Narrative:  Start time: 06/06/2015 5:40 PM End time: 06/06/2015 5:50 PM Injection made incrementally with aspirations every 5 mL. Anesthesiologist: Roderic Palau  Additional Notes: 2% Lidocaine skin wheel.

## 2015-06-06 NOTE — Discharge Instructions (Signed)
° °

## 2015-06-06 NOTE — Op Note (Signed)
   Date of Surgery: 06/06/2015  INDICATIONS: Mr. Harold Hicks is a 75 y.o.-year-old male with a left humeral nonunion that had been treated nonoperatively at an outside hospital;  The patient did consent to the procedure after discussion of the risks and benefits.  PREOPERATIVE DIAGNOSIS: Left humeral shaft fibrous nonunion  POSTOPERATIVE DIAGNOSIS: Same.  PROCEDURE: Open treatment internal fixation of left humerus nonunion  SURGEON: N. Eduard Roux, M.D.  ASSIST: none.  ANESTHESIA:  general, regional  IV FLUIDS AND URINE: See anesthesia.  ESTIMATED BLOOD LOSS: 150 mL.  IMPLANTS: Tamala Julian and nephew 9 hole narrow 4.5 mm plate  DRAINS: none  COMPLICATIONS: None.  DESCRIPTION OF PROCEDURE: The patient was brought to the operating room and placed supine on the operating table.  The patient had been signed prior to the procedure and this was documented. The patient had the anesthesia placed by the anesthesiologist.  A time-out was performed to confirm that this was the correct patient, site, side and location. The patient did receive antibiotics prior to the incision and was re-dosed during the procedure as needed at indicated intervals.  A tourniquet not placed.  The patient had the operative extremity prepped and draped in the standard surgical fashion.    A standard anterolateral approach to the humerus was utilized. Blunt dissection was carried down to the biceps. The biceps was retracted medially. Blunt dissection was carried through in line with the fibers of the brachialis muscle down to the fracture site. The fracture was exposed. A Cobb was used to elevate the muscle off of the humerus. There was evidence of a fibrous nonunion. There was interposed brachialis muscle within the fracture site preventing fracture healing. Once we had the fracture exposed we clear the fracture site of fibrous material using a rongeur, curet, high-speed burr. Once we had bleeding surfaces we did obtain a reduction  of the fracture. There was only one major fracture line. The other fracture closer to the proximal humerus had healed. We were able to reduce the fracture as well as possible.  Given the fact that there was callus formation it was impossible to achieve anatomic reduction. The decision was made to fix the fracture through secondary bone healing using a bridge plate. We had the overall alignment of the humerus confirmed under fluoroscopy. A 9 hole 4.5 mm narrow plate was placed on the lateral aspect of the humeral shaft. We placed 3 nonlocking screws proximally and distally to the fracture. We then placed 5 mL of demineralized bone matrix putty in the fracture site to promote healing. Given the fact that he is diabetic I did also placed 5 mL of vancomycin impregnated stimulant beads.  Final x-rays were taken. The wound was then thoroughly irrigated with 4 L of normal saline. Hemostasis was achieved. The wound was closed in layer fashion using 0 Vicryl, 2-0 Vicryl, 4-0 Monocryl. Sterile dressings were applied. Patient tolerated the procedure well and no immediate complications. A neurovascular exam in the PACU showed that the patient was neurovascularly intact prior to the regional block was administered by the anesthesiologist.  POSTOPERATIVE PLAN: She will be nonweightbearing to the left upper extremity. He will undergo range of motion of the shoulder and elbow. Admitted overnight for pain control.  Azucena Cecil, MD [place] 5:16 PM

## 2015-06-06 NOTE — H&P (Signed)
PREOPERATIVE H&P  Chief Complaint: left humerus nonunion  HPI: Harold Hicks is a 75 y.o. male who presents for surgical treatment of left humerus nonunion.  He denies any changes in medical history.  Past Medical History  Diagnosis Date  . Hypertension   . Pneumonia     hx of  . Diabetes mellitus without complication ([place])     Type 2; takes Metformin  . GERD (gastroesophageal reflux disease)   . Arthritis   . 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 Environmental and seasonal allergies   . Hyperlipemia   . Coronary artery disease      BMS x3 to ramus intermediate 12/02/07 that failed -> s/p CABG (LIMA to LAD, SVG to ramus 12/06/07) (at [facility])   Past Surgical History  Procedure Laterality Date  . Coronary artery bypass graft    . Cardiac catheterization    . Colonoscopy w/ polypectomy    . 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 Marital Status: Married    Spouse Name: N/A  . Number of Children: N/A  . Years of Education: N/A   Social History Main Topics  . Smoking status: Current Some Day Smoker    Types: Cigars  . Smokeless tobacco: Not on file  . Alcohol Use: Yes     Comment: moderate   . Drug Use: No  . Sexual Activity: Not on file   Other Topics Concern  . Not on file   Social History Narrative   No family history on file. No Known Allergies Prior to Admission medications   Medication Sig Start Date End Date Taking? Authorizing Provider  aspirin 325 MG tablet Take 325 mg by mouth daily.   Yes Historical Provider, MD  calcium carbonate (OS-CAL) 1250 (500 Ca) MG chewable tablet Chew 1 tablet by mouth daily.   Yes Historical Provider, MD  co-enzyme Q-10 30 MG capsule Take 30 mg by mouth daily.   Yes Historical Provider, MD  hydrochlorothiazide (HYDRODIURIL) 25 MG tablet Take 25 mg by mouth daily. 03/25/15  Yes Historical Provider, MD  metFORMIN (GLUCOPHAGE) 500  MG tablet Take 500 mg by mouth 2 (two) times daily. 03/25/15  Yes Historical Provider, MD  metoprolol succinate (TOPROL-XL) 100 MG 24 hr tablet Take 100 mg by mouth daily. 03/25/15  Yes Historical Provider, MD  montelukast (SINGULAIR) 10 MG tablet Take 10 mg by mouth at bedtime. 03/24/15  Yes Historical Provider, MD  olmesartan (BENICAR) 40 MG tablet Take 40 mg by mouth daily. 03/25/15  Yes Historical Provider, MD  Omega-3 Fatty Acids (FISH OIL) 1000 MG CAPS Take 1,000 mg by mouth daily.   Yes Historical Provider, MD  omeprazole (PRILOSEC) 20 MG capsule Take 20 mg by mouth daily. 03/25/15  Yes Historical Provider, MD  simvastatin (ZOCOR) 20 MG tablet Take 20 mg by mouth every evening. 03/25/15  Yes Historical Provider, MD     Positive ROS: All other systems have been reviewed and were otherwise negative with the exception of those mentioned in the HPI and as above.  Physical Exam: General: Alert, no acute distress Cardiovascular: No pedal edema Respiratory: No cyanosis, no use of accessory musculature GI: abdomen soft Skin: No lesions in the area of chief complaint Neurologic: Sensation intact distally Psychiatric: Patient is competent for consent with normal mood and affect Lymphatic: no lymphedema  MUSCULOSKELETAL: exam stable  Assessment: left humerus nonunion  Plan: Plan for Procedure(s): OPEN REDUCTION INTERNAL FIXATION (ORIF) LEFT HUMERUS NONUNION  The risks benefits and alternatives were discussed with the patient including but not limited to the risks of nonoperative treatment, versus surgical intervention including infection, bleeding, nerve injury,  blood clots, cardiopulmonary complications, morbidity, mortality, among others, and they were willing to proceed.   Marianna Payment, MD   06/06/2015 6:46 AM

## 2015-06-06 NOTE — Anesthesia Preprocedure Evaluation (Addendum)
Anesthesia Evaluation  Patient identified by MRN, date of birth, ID band Patient awake    Reviewed: Allergy & Precautions, NPO status , Patient's Chart, lab work & pertinent test results, reviewed documented beta blocker date and time   Airway Mallampati: II  TM Distance: >3 FB Neck ROM: Full    Dental  (+) Teeth Intact   Pulmonary Current Smoker,    breath sounds clear to auscultation       Cardiovascular hypertension, Pt. on medications and Pt. on home beta blockers + CAD and + CABG   Rhythm:Regular Rate:Normal     Neuro/Psych negative neurological ROS  negative psych ROS   GI/Hepatic GERD  Medicated and Controlled,  Endo/Other  diabetes, Well Controlled, Type 2, Oral Hypoglycemic Agents  Renal/GU   negative genitourinary   Musculoskeletal  (+) Arthritis , Osteoarthritis,    Abdominal   Peds  Hematology negative hematology ROS (+)   Anesthesia Other Findings   Reproductive/Obstetrics negative OB ROS                            Lab Results  Component Value Date   WBC 8.5 06/06/2015   HGB 13.7 06/06/2015   HCT 38.0* 06/06/2015   MCV 84.1 06/06/2015   PLT 189 06/06/2015   Lab Results  Component Value Date   CREATININE 0.93 05/20/2015   BUN 19 05/20/2015   NA 136 05/20/2015   K 3.8 05/20/2015   CL 94* 05/20/2015   CO2 26 05/20/2015   No results found for: INR, PROTIME  05/2015 EKG: normal sinus rhythm, nonspecific ST and T waves changes.  Anesthesia Physical Anesthesia Plan  ASA: III  Anesthesia Plan: General and Regional   Post-op Pain Management: GA combined w/ Regional for post-op pain   Induction: Intravenous  Airway Management Planned: Oral ETT  Additional Equipment:   Intra-op Plan:   Post-operative Plan: Extubation in OR  Informed Consent: I have reviewed the patients History and Physical, chart, labs and discussed the procedure including the risks,  benefits and alternatives for the proposed anesthesia with the patient or authorized representative who has indicated his/her understanding and acceptance.   Dental advisory given  Plan Discussed with: CRNA  Anesthesia Plan Comments: (Mr. Hartkopf was consented for supraclavicular block in preop. He is agreeable to postoperative nerve block.   Anesthetic plan discussed in detail. Associated risk discussed including but not limited to life threatening cardiovascular, pulmonary events and dental damage. The postoperative pain management and antiemetic plan discussed with patient. All questions answered in detail. Patient is in agreement.   )       Anesthesia Quick Evaluation

## 2015-06-07 DIAGNOSIS — S42302K Unspecified fracture of shaft of humerus, left arm, subsequent encounter for fracture with nonunion: Secondary | ICD-10-CM | POA: Diagnosis not present

## 2015-06-07 LAB — GLUCOSE, CAPILLARY
GLUCOSE-CAPILLARY: 186 mg/dL — AB (ref 65–99)
GLUCOSE-CAPILLARY: 216 mg/dL — AB (ref 65–99)
Glucose-Capillary: 174 mg/dL — ABNORMAL HIGH (ref 65–99)

## 2015-06-07 NOTE — Discharge Summary (Signed)
Physician Discharge Summary  Patient ID: Harold Hicks MRN: [id number] DOB/AGE: 1940/05/01 75 y.o.  Admit date: 06/06/2015 Discharge date: 06/07/2015  Admission Diagnoses:Nonunion left humerus fracture  Discharge Diagnoses:  Active Problems:   Fracture of left humerus with nonunion   S/P ORIF (open reduction internal fixation) fracture   Discharged Condition: stable  Hospital Course: Patient's hospital course was essentially unremarkable. He underwent open reduction internal fixation for nonunion of a humerus fracture. Patient progressed well and was discharged to home in stable condition.  Consults: None  Significant Diagnostic Studies: labs: Routine labs  Treatments: surgery: See operative note  Discharge Exam: Blood pressure 121/65, pulse 87, temperature 98.5 F (36.9 C), temperature source Oral, resp. rate 16, weight 92.987 kg (205 lb), SpO2 89 %. Incision/Wound: Dressing clean and dry  Disposition: Final discharge disposition not confirmed     Medication List    TAKE these medications        calcium-vitamin D 500-200 MG-UNIT tablet  Commonly known as:  OSCAL WITH D  Take 1 tablet by mouth 3 (three) times daily.     methocarbamol 750 MG tablet  Commonly known as:  ROBAXIN  Take 1 tablet (750 mg total) by mouth 2 (two) times daily as needed for muscle spasms.     ondansetron 4 MG tablet  Commonly known as:  ZOFRAN  Take 1-2 tablets (4-8 mg total) by mouth every 8 (eight) hours as needed for nausea or vomiting.     oxyCODONE 10 mg 12 hr tablet  Commonly known as:  OXYCONTIN  Take 1 tablet (10 mg total) by mouth every 12 (twelve) hours.     oxyCODONE-acetaminophen 5-325 MG tablet  Commonly known as:  PERCOCET  Take 1-2 tablets by mouth every 4 (four) hours as needed for severe pain.     senna-docusate 8.6-50 MG tablet  Commonly known as:  SENOKOT S  Take 1 tablet by mouth at bedtime as needed.     Vitamin C 500 MG Chew  Chew 1 tablet (500 mg total) by  mouth 2 (two) times daily.     zinc sulfate 220 MG capsule  Take 1 capsule (220 mg total) by mouth daily.      ASK your doctor about these medications        aspirin 325 MG tablet  Take 325 mg by mouth daily.     calcium carbonate 1250 (500 Ca) MG chewable tablet  Commonly known as:  OS-CAL  Chew 1 tablet by mouth daily.     co-enzyme Q-10 30 MG capsule  Take 30 mg by mouth daily.     Fish Oil 1000 MG Caps  Take 1,000 mg by mouth daily.     hydrochlorothiazide 25 MG tablet  Commonly known as:  HYDRODIURIL  Take 25 mg by mouth daily.     metFORMIN 500 MG tablet  Commonly known as:  GLUCOPHAGE  Take 500 mg by mouth 2 (two) times daily.     metoprolol succinate 100 MG 24 hr tablet  Commonly known as:  TOPROL-XL  Take 100 mg by mouth daily.     montelukast 10 MG tablet  Commonly known as:  SINGULAIR  Take 10 mg by mouth at bedtime.     olmesartan 40 MG tablet  Commonly known as:  BENICAR  Take 40 mg by mouth daily.     omeprazole 20 MG capsule  Commonly known as:  PRILOSEC  Take 20 mg by mouth daily.     simvastatin 20 MG  tablet  Commonly known as:  ZOCOR  Take 20 mg by mouth every evening.           Follow-up Information    Follow up with Marianna Payment, MD In 2 weeks.   Specialty:  Orthopedic Surgery   Why:  For wound re-check, For suture removal   Contact information:   [place] [phone]       Signed: Newt Minion 06/07/2015, 11:49 AM

## 2015-06-07 NOTE — Progress Notes (Signed)
Pt discharge education and instructions completed with pt at bedside; pt denies any questions. LUE incision dsg remains clean, dry and intact, sling on and neuro check wnl. Pt IV removed; pt discharge home with spouse to transport him home. Pt report MD giving all his prescriptions to spouse. Pt transported off unit via wheelchair with spouse and belonging to the side. Delia Heady RN

## 2015-06-07 NOTE — Progress Notes (Signed)
Occupational Therapy Evaluation Patient Details Name: Alecxis Quattrone MRN: [id number] DOB: 04-Oct-1940 Today's Date: 06/07/2015    History of Present Illness s/p ORIF L humerus for nonhealing L humeral fx   Clinical Impression   Completed all education regarding compensatory techniques for ADL, management of LUE and HEP. Discussed with Dr. Sharol Given who said pt could complete A/AAROM within pain tolerance. Pt orthostatic during session and symptomatic. BP 135/72 (initially); sitting 118/64; standing 110/52. Nsg notified. Pt safe to D/C home when medically stable. All further outpt therapy can be addressed by Dr. Erlinda Hong at pt's next follow up visit. OT signing off.     Follow Up Recommendations  Supervision - Intermittent (as directed by Dr. Erlinda Hong)    Equipment Recommendations  None recommended by OT    Recommendations for Other Services       Precautions / Restrictions Precautions Precautions: Other (comment) (NWB) Restrictions Weight Bearing Restrictions: Yes LUE Weight Bearing: Non weight bearing      Mobility Bed Mobility Overal bed mobility: Modified Independent                Transfers Overall transfer level: Needs assistance Equipment used: 1 person hand held assist Transfers: Sit to/from Stand Sit to Stand: Min guard         General transfer comment: due to BP issues    Balance                                            ADL Overall ADL's : Needs assistance/impaired     Grooming: Minimal assistance   Upper Body Bathing: Minimal assitance   Lower Body Bathing: Minimal assistance   Upper Body Dressing : Minimal assistance   Lower Body Dressing: Minimal assistance               Functional mobility during ADLs: Min guard (due ot low BP) General ADL Comments: Completed education with pt/wife regarding compensatory tecniques for ADL, sling mangement adn LUE management. Pt/wife verbalized understadning. written information given.      Vision     Perception     Praxis      Pertinent Vitals/Pain Pain Assessment: 0-10 Pain Score: 6  Pain Location: L UE Pain Descriptors / Indicators: Aching Pain Intervention(s): Premedicated before session;Limited activity within patient's tolerance;Ice applied;Repositioned     Hand Dominance Left   Extremity/Trunk Assessment Upper Extremity Assessment Upper Extremity Assessment: LUE deficits/detail LUE Deficits / Details: wrist hand AROM WFL. Limitation in shoulder and elbow ROM LUE Coordination: decreased gross motor   Lower Extremity Assessment Lower Extremity Assessment: Overall WFL for tasks assessed   Cervical / Trunk Assessment Cervical / Trunk Assessment: Normal   Communication Communication Communication: No difficulties   Cognition Arousal/Alertness: Awake/alert Behavior During Therapy: WFL for tasks assessed/performed Overall Cognitive Status: Within Functional Limits for tasks assessed                     General Comments       Exercises Exercises: Shoulder     Shoulder Instructions Shoulder Instructions Donning/doffing shirt without moving shoulder: Caregiver independent with task Method for sponge bathing under operated UE: Caregiver independent with task Donning/doffing sling/immobilizer: Caregiver independent with task Correct positioning of sling/immobilizer: Caregiver independent with task Pendulum exercises (written home exercise program): Caregiver independent with task;Patient able to independently direct caregiver ROM for elbow, wrist and digits of operated UE:  Independent Sling wearing schedule (on at all times/off for ADL's): Independent Proper positioning of operated UE when showering: Independent Dressing change: Independent Positioning of UE while sleeping: [place] expects to be discharged to:: Private residence Living Arrangements: Spouse/significant other Available Help at Discharge:  Family;Available 24 hours/day Type of Home: House Home Access: Stairs to enter [employer] of Steps: 1   Home Layout: One level     Bathroom Shower/Tub: Occupational psychologist: Standard Bathroom Accessibility: Yes How Accessible: Accessible via walker Home Equipment: Shower seat - built in          Prior Functioning/Environment Level of Independence: Independent             OT Diagnosis: Acute pain;Generalized weakness   OT Problem List: Decreased strength;Decreased range of motion;Decreased knowledge of precautions;Impaired UE functional use;Pain   OT Treatment/Interventions:      OT Goals(Current goals can be found in the care plan section) Acute Rehab OT Goals Patient Stated Goal: to be able to use my Left arm OT Goal Formulation: All assessment and education complete, DC therapy  OT Frequency:     Barriers to D/C:            Co-evaluation              End of Session Nurse Communication: Mobility status;Other (comment) (BP issues)  Activity Tolerance: Other (comment) (orthostatic. nsg aware) Patient left: in chair;with call bell/phone within reach;with family/visitor present   Time: 1130-1210 OT Time Calculation (min): 40 min Charges:  OT General Charges $OT Visit: 1 Procedure OT Evaluation $OT Eval Moderate Complexity: 1 Procedure OT Treatments $Self Care/Home Management : 8-22 mins $Therapeutic Exercise: 8-22 mins G-Codes: OT G-codes **NOT FOR INPATIENT CLASS** Functional Assessment Tool Used: clinical judgement Functional Limitation: Self care Self Care Current Status [id number]): At least 20 percent but less than 40 percent impaired, limited or restricted Self Care Goal Status [id number]): At least 1 percent but less than 20 percent impaired, limited or restricted Self Care Discharge Status [phone]): At least 1 percent but less than 20 percent impaired, limited or restricted  Kasarah Sitts,HILLARY 06/07/2015, 12:20 PM   [facility],  OTR/L  [phone] 06/07/2015

## 2015-06-07 NOTE — Progress Notes (Signed)
Orthopedic Tech Progress Note Patient Details:  Jaetyn Wiggans [birth year] [id number]  Patient ID: Heidi Dach, male   DOB: April 08, 1941, 75 y.o.   MRN: [id number]   [place] Pink 06/07/2015, 10:46 AMTrapeze bar.

## 2015-06-09 ENCOUNTER — Encounter (HOSPITAL_COMMUNITY): Payer: Self-pay | Admitting: Orthopaedic Surgery

## 2015-06-09 NOTE — Anesthesia Postprocedure Evaluation (Signed)
Anesthesia Post Note  Patient: Harold Hicks  Procedure(s) Performed: Procedure(s) (LRB): OPEN REDUCTION INTERNAL FIXATION (ORIF) LEFT HUMERUS NONUNION (Left)  Patient location during evaluation: PACU Anesthesia Type: General Level of consciousness: awake and alert Pain management: pain level controlled Vital Signs Assessment: post-procedure vital signs reviewed and stable Respiratory status: spontaneous breathing, nonlabored ventilation, respiratory function stable and patient connected to nasal cannula oxygen Cardiovascular status: blood pressure returned to baseline and stable Postop Assessment: no signs of nausea or vomiting Anesthetic complications: no    Last Vitals:  Filed Vitals:   06/07/15 0100 06/07/15 0500  BP: 158/74 121/65  Pulse: 70 87  Temp: 36.5 C 36.9 C  Resp:      Last Pain:  Filed Vitals:   06/07/15 1124  PainSc: [place] DAVID

## 2015-06-11 ENCOUNTER — Encounter (HOSPITAL_COMMUNITY): Payer: Self-pay | Admitting: Orthopaedic Surgery

## 2017-05-22 IMAGING — RF DG HUMERUS 2V *L*
1 series · 4 of 4 positions shown · non-contrast
Comparison: None.

CLINICAL DATA: Status post ORIF of left humeral fracture

EXAM:
DG C-ARM GT 120 MIN; LEFT HUMERUS - 2+ VIEW

[Series 1: run · 4 of 4 slices shown]
[im 1/4]
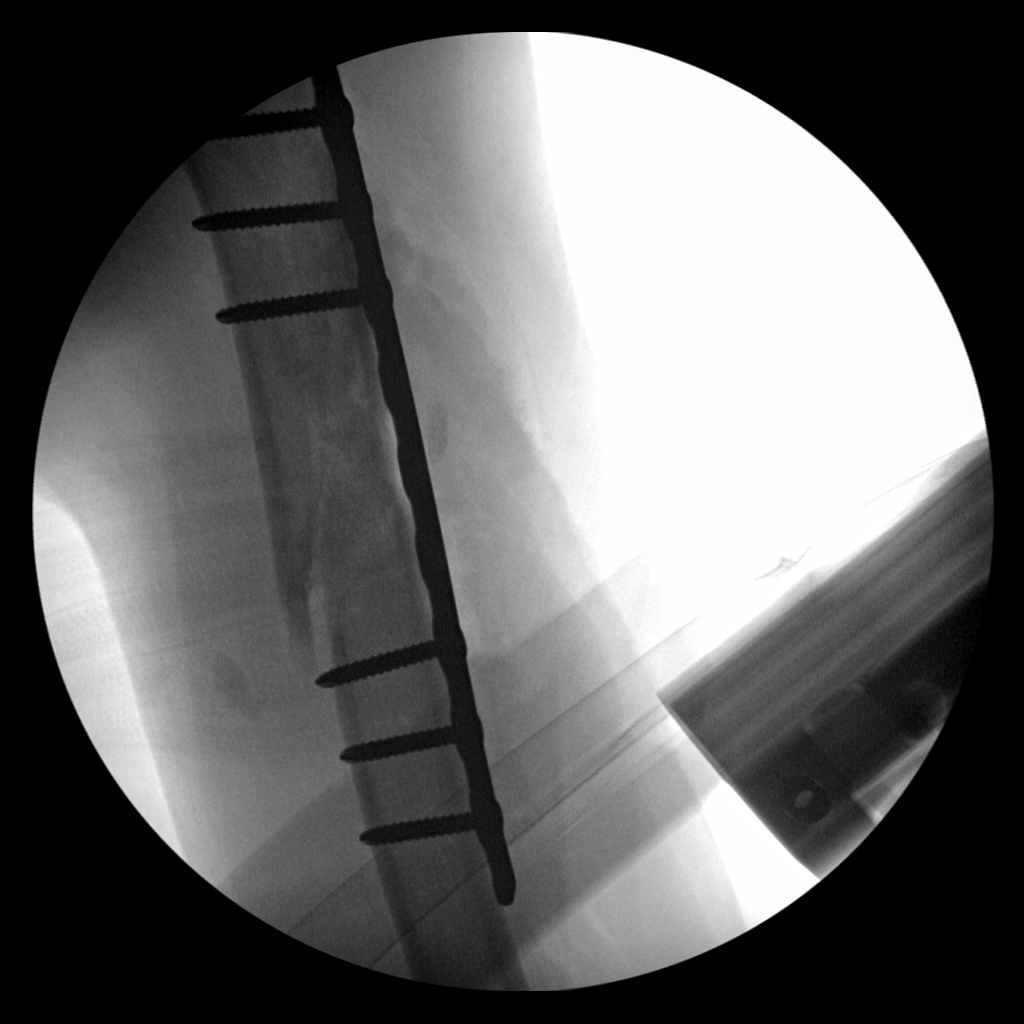
[im 2/4]
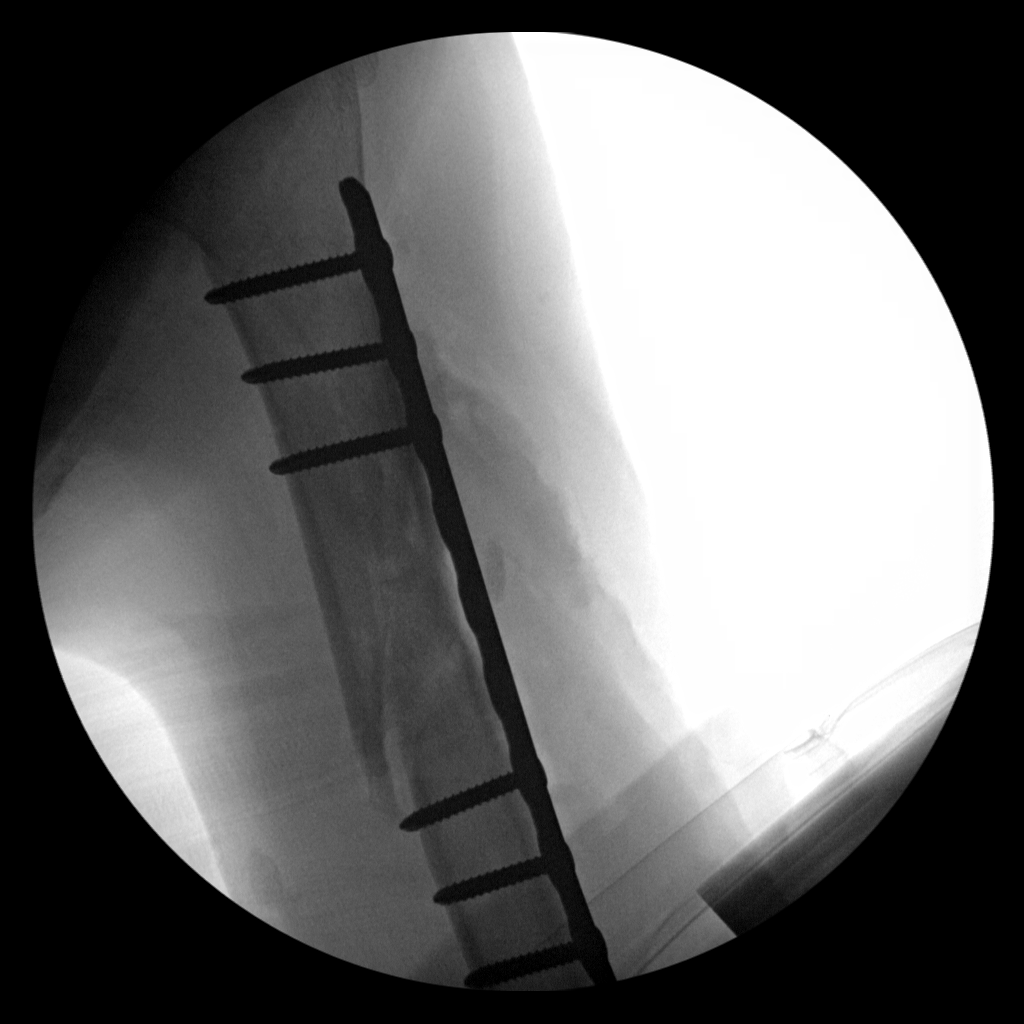
[im 3/4]
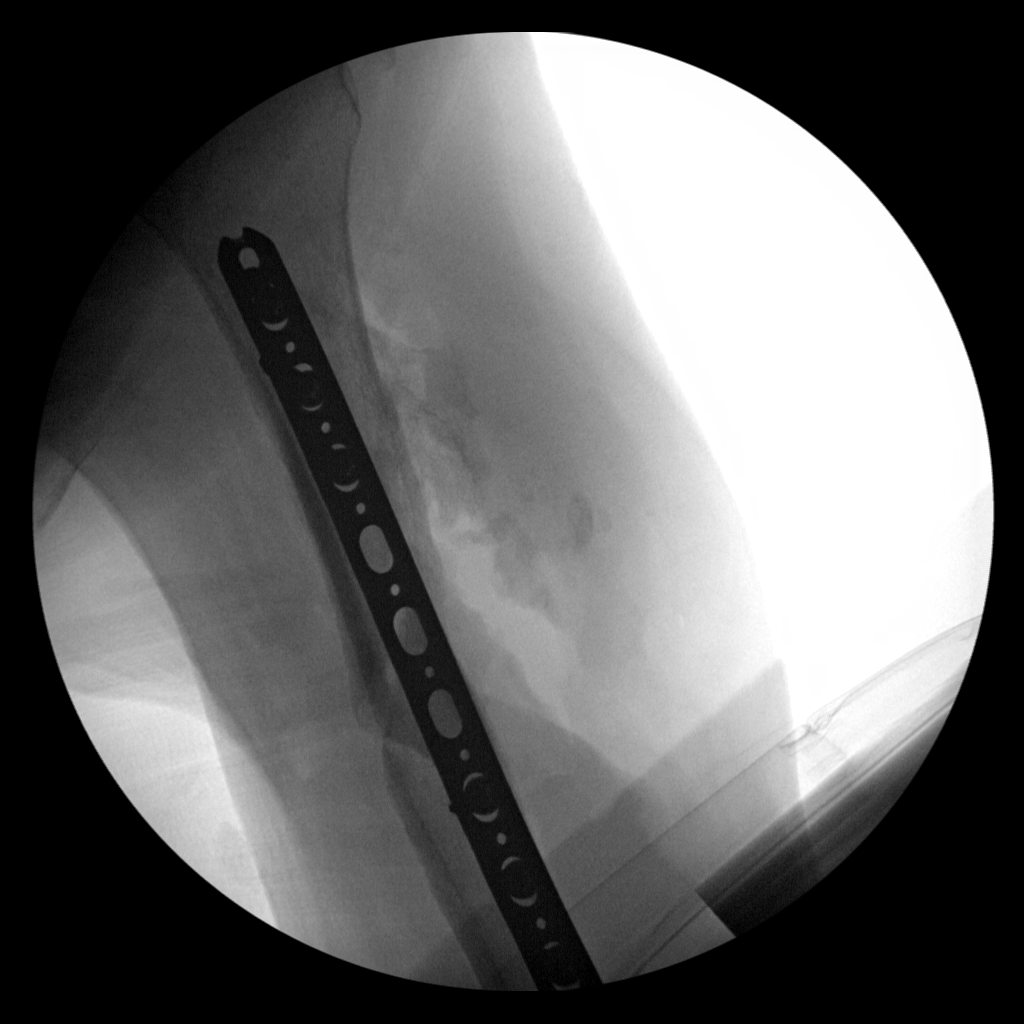
[im 4/4]
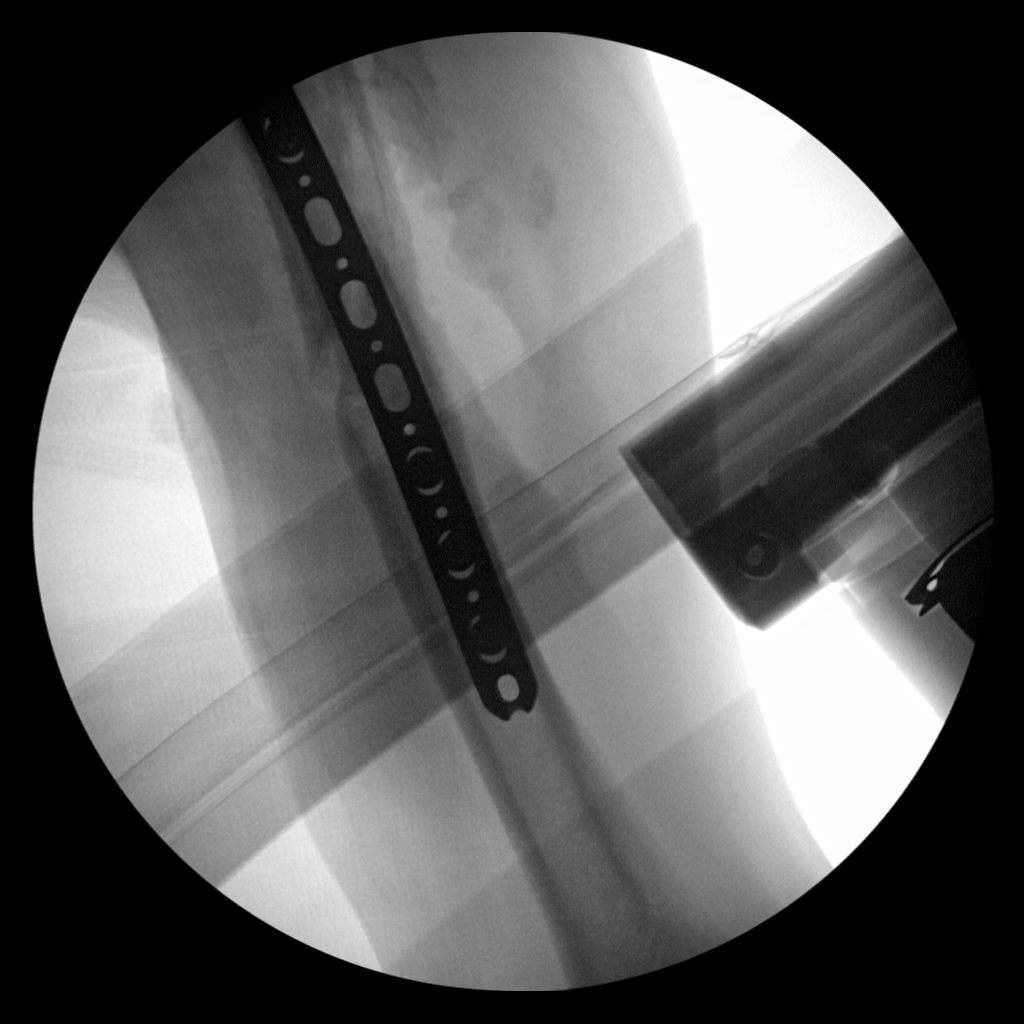

[4 of 4 positions shown; findings below may reference images not displayed]

FLUOROSCOPY TIME:  Radiation Exposure Index (as provided by the
fluoroscopic device): Not available

If the device does not provide the exposure index:

Fluoroscopy Time:  1 minutes 2 seconds

Number of Acquired Images:  4
FINDINGS: A fixation sideplate is noted along a left humeral fracture.
Fracture fragments are in near anatomic alignment.
IMPRESSION: Status post ORIF of left humeral fracture.

## 2024-04-12 DEATH — deceased
